# Patient Record
Sex: Female | Born: 1995 | Race: White | Hispanic: No | Marital: Single | Smoking: Never smoker
Health system: Southern US, Community
[De-identification: ages and names within clinical notes are randomized; demographics above are authoritative.]

## PROBLEM LIST (undated history)

## (undated) DIAGNOSIS — D649 Anemia, unspecified: Secondary | ICD-10-CM

## (undated) DIAGNOSIS — E162 Hypoglycemia, unspecified: Secondary | ICD-10-CM

## (undated) HISTORY — PX: INTRAUTERINE DEVICE (IUD) INSERTION: SHX5877

## (undated) HISTORY — PX: OTHER SURGICAL HISTORY: SHX169

## (undated) HISTORY — PX: WISDOM TOOTH EXTRACTION: SHX21

---

## 2020-11-25 ENCOUNTER — Emergency Department (HOSPITAL_BASED_OUTPATIENT_CLINIC_OR_DEPARTMENT_OTHER): Payer: Medicaid Other

## 2020-11-25 ENCOUNTER — Other Ambulatory Visit: Payer: Self-pay

## 2020-11-25 ENCOUNTER — Encounter (HOSPITAL_BASED_OUTPATIENT_CLINIC_OR_DEPARTMENT_OTHER): Payer: Self-pay | Admitting: Emergency Medicine

## 2020-11-25 ENCOUNTER — Emergency Department (HOSPITAL_BASED_OUTPATIENT_CLINIC_OR_DEPARTMENT_OTHER)
Admission: EM | Admit: 2020-11-25 | Discharge: 2020-11-25 | Disposition: A | Discharge status: Home / Self Care | Payer: Medicaid Other | Attending: Emergency Medicine | Admitting: Emergency Medicine

## 2020-11-25 DIAGNOSIS — R1032 Left lower quadrant pain: Secondary | ICD-10-CM | POA: Insufficient documentation

## 2020-11-25 DIAGNOSIS — R102 Pelvic and perineal pain: Secondary | ICD-10-CM

## 2020-11-25 DIAGNOSIS — Z91040 Latex allergy status: Secondary | ICD-10-CM | POA: Insufficient documentation

## 2020-11-25 DIAGNOSIS — N898 Other specified noninflammatory disorders of vagina: Secondary | ICD-10-CM | POA: Diagnosis present

## 2020-11-25 HISTORY — DX: Anemia, unspecified: D64.9

## 2020-11-25 HISTORY — DX: Hypoglycemia, unspecified: E16.2

## 2020-11-25 LAB — URINALYSIS, ROUTINE W REFLEX MICROSCOPIC
Bilirubin Urine: NEGATIVE
Glucose, UA: NEGATIVE mg/dL
Hgb urine dipstick: NEGATIVE
Ketones, ur: NEGATIVE mg/dL
Nitrite: NEGATIVE
Protein, ur: NEGATIVE mg/dL
Specific Gravity, Urine: 1.01 (ref 1.005–1.030)
pH: 6 (ref 5.0–8.0)

## 2020-11-25 LAB — URINALYSIS, MICROSCOPIC (REFLEX)

## 2020-11-25 LAB — WET PREP, GENITAL
Clue Cells Wet Prep HPF POC: NONE SEEN
Sperm: NONE SEEN
Trich, Wet Prep: NONE SEEN
Yeast Wet Prep HPF POC: NONE SEEN

## 2020-11-25 LAB — PREGNANCY, URINE: Preg Test, Ur: NEGATIVE

## 2020-11-25 NOTE — ED Notes (Signed)
Pelvic cart at bedside. 

## 2020-11-25 NOTE — ED Provider Notes (Signed)
[facility] EMERGENCY DEPARTMENT Provider Note   CSN: [id number] Arrival date & time: 11/25/20  5732     History Chief Complaint  Patient presents with  . Abdominal Pain  . Vaginal Discharge    Tara Roman is a 24 y.o. female.  Patient is a 24 year old female with history of anemia and hypoglycemia.  She presents today for evaluation of vaginal leakage.  Patient tells me she woke up in the night to go to the bathroom and clear fluid leak from her vagina and down her leg.  She describes mild discomfort to the left lower quadrant, but otherwise denies pain.  She denies any bleeding.  Patient tells me she has had an IUD in place for nearly 1 year and denies being sexually active for the past 4 months.  The history is provided by the patient.  Abdominal Pain Pain location:  LLQ Pain radiates to:  Does not radiate Pain severity:  Mild Onset quality:  Sudden Timing:  Constant Progression:  Unchanged Relieved by:  Nothing Worsened by:  Nothing Associated symptoms: vaginal discharge   Vaginal Discharge Associated symptoms: abdominal pain        Past Medical History:  Diagnosis Date  . Anemia   . Hypoglycemia     There are no problems to display for this patient.   Past Surgical History:  Procedure Laterality Date  . INTRAUTERINE DEVICE (IUD) INSERTION    . OTHER SURGICAL HISTORY    . WISDOM TOOTH EXTRACTION       OB History   No obstetric history on file.     No family history on file.  Social History   Tobacco Use  . Smoking status: Never Smoker  . Smokeless tobacco: Never Used  Substance Use Topics  . Alcohol use: Not on file  . Drug use: Not on file    Home Medications Prior to Admission medications   Medication Sig Start Date End Date Taking? Authorizing Provider  levonorgestrel (MIRENA) 20 MCG/24HR IUD by Intrauterine route. 02/01/17 02/01/21 Yes [provider]    Allergies    Latex  Review of Systems   Review of Systems    Gastrointestinal: Positive for abdominal pain.  Genitourinary: Positive for vaginal discharge.  All other systems reviewed and are negative.   Physical Exam Updated Vital Signs BP 118/66   Pulse 75   Temp 98.5 F (36.9 C) (Oral)   Resp 16   Ht 5\' 5"  (1.651 m)   Wt 83.4 kg   LMP 11/11/2020   SpO2 100%   BMI 30.59 kg/m   Physical Exam Vitals and nursing note reviewed.  Constitutional:      General: She is not in acute distress.    Appearance: She is well-developed. She is not diaphoretic.  HENT:     Head: Normocephalic and atraumatic.  Cardiovascular:     Rate and Rhythm: Normal rate and regular rhythm.     Heart sounds: No murmur heard.  No friction rub. No gallop.   Pulmonary:     Effort: Pulmonary effort is normal. No respiratory distress.     Breath sounds: Normal breath sounds. No wheezing.  Abdominal:     General: Bowel sounds are normal. There is no distension.     Palpations: Abdomen is soft.     Tenderness: There is abdominal tenderness in the left lower quadrant. There is no right CVA tenderness, left CVA tenderness, guarding or rebound.  Genitourinary:    Vagina: Tenderness present. No vaginal discharge  or bleeding.     Comments: There is mild right adnexal tenderness but no masses are palpable. Musculoskeletal:        General: Normal range of motion.     Cervical back: Normal range of motion and neck supple.  Skin:    General: Skin is warm and dry.  Neurological:     Mental Status: She is alert and oriented to person, place, and time.     ED Results / Procedures / Treatments   Labs (all labs ordered are listed, but only abnormal results are displayed) Labs Reviewed  WET PREP, GENITAL  PREGNANCY, URINE  URINALYSIS, ROUTINE W REFLEX MICROSCOPIC  GC/CHLAMYDIA PROBE AMP ([place]) NOT AT [facility]    EKG None  Radiology No results found.  Procedures Procedures (including critical care time)  Medications Ordered in ED Medications - No data to  display  ED Course  I have reviewed the triage vital signs and the nursing notes.  Pertinent labs & imaging results that were available during my care of the patient were reviewed by me and considered in my medical decision making (see chart for details).    MDM Rules/Calculators/A&P  Patient is a 24 year old female presenting with complaints of vaginal discharge as described in the HPI.  She describes a clear gush of fluid that ran down her leg this morning when she woke from sleep.  Pelvic examination is unremarkable with unremarkable wet prep.  GC and Chlamydia cultures are pending.  Pregnancy test is negative.  Ultrasound shows no evidence of fluid collection, torsion, or other abnormality.  At this point, I feel as though emergent pathology has been ruled out.  Patient will be discharged with rest and as needed return.  Final Clinical Impression(s) / ED Diagnoses Final diagnoses:  None    Rx / DC Orders ED Discharge Orders    None       Geoffery Lyons, MD 11/25/20 1202

## 2020-11-25 NOTE — ED Triage Notes (Signed)
Pt states she has an copper IUD in place for less than one year.  During the night she woke up to urinate and clear fluid gushed down her leg.  It was clear.  She was able to urinate afterwards but continues to have clear fluid coming out of her vagina.

## 2020-11-25 NOTE — Discharge Instructions (Addendum)
Return to the emergency department if you develop high fevers, worsening pain, severe bleeding, or other new and concerning symptoms.

## 2020-11-25 NOTE — ED Notes (Signed)
Review D/C papers with pt, pt states understanding, pt denies questions at this time. 

## 2020-11-26 LAB — GC/CHLAMYDIA PROBE AMP (~~LOC~~) NOT AT ARMC
Chlamydia: NEGATIVE
Comment: NEGATIVE
Comment: NORMAL
Neisseria Gonorrhea: NEGATIVE

## 2021-04-09 IMAGING — US US PELVIS COMPLETE TRANSABD/TRANSVAG W DUPLEX
1 series · 13 of 25 positions shown · non-contrast
Comparison: 04/17/2020

CLINICAL DATA: Pelvic pain, incidental clear fluid coming from
vagina, has had Seraphin IUD for 1 year; LMP 11/10/2020

EXAM:
TRANSABDOMINAL AND TRANSVAGINAL ULTRASOUND OF PELVIS
DOPPLER ULTRASOUND OF OVARIES
TECHNIQUE: Both transabdominal and transvaginal ultrasound examinations of the
pelvis were performed. Transabdominal technique was performed for
global imaging of the pelvis including uterus, ovaries, adnexal
regions, and pelvic cul-de-sac.
It was necessary to proceed with endovaginal exam following the
transabdominal exam to visualize the endometrium. Color and duplex
Doppler ultrasound was utilized to evaluate blood flow to the
ovaries.

[Series 1: us pelvis complete transabd/transvag w duplex · 75 acquisitions, 13 frames shown]
[im 1/75]
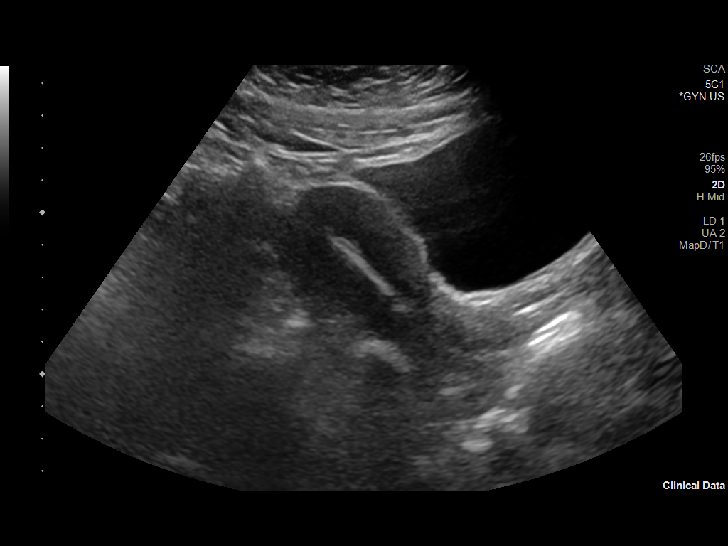
[im 7/75]
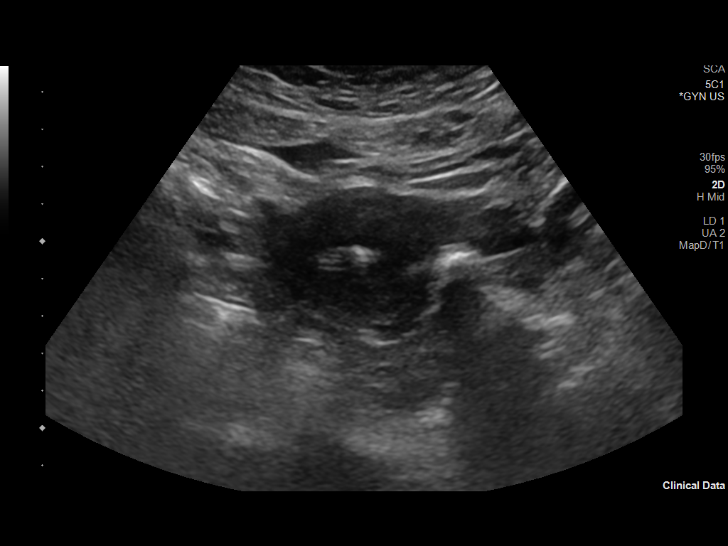
[im 13/75]
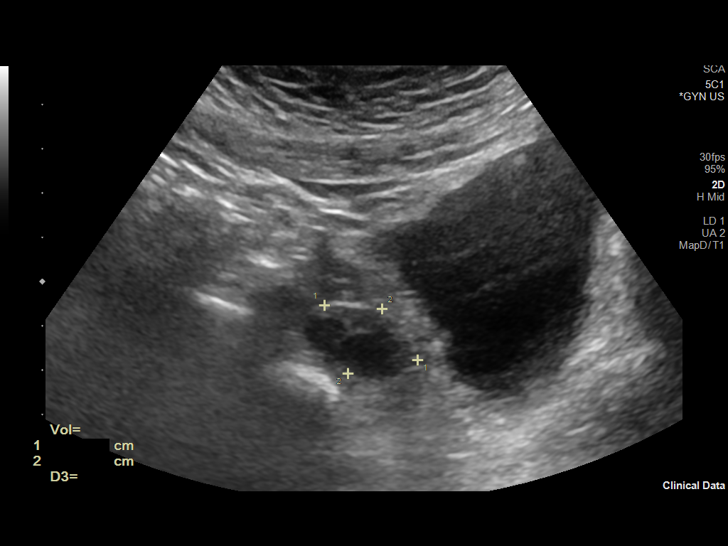
[im 19/75]
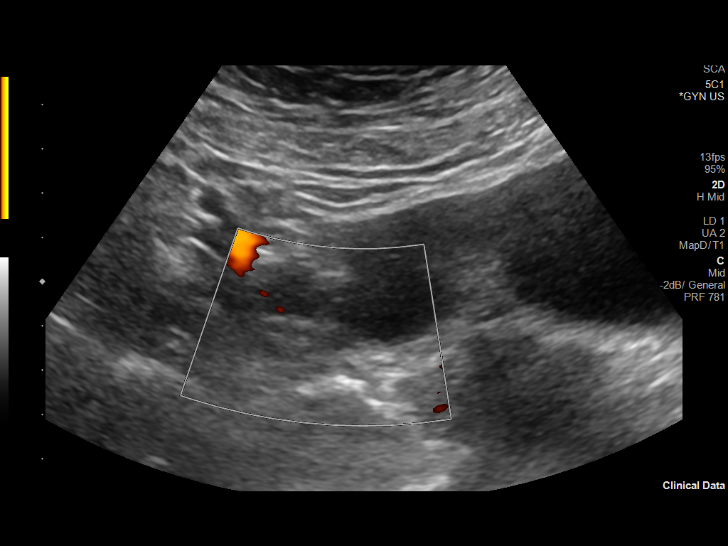
[im 25/75]
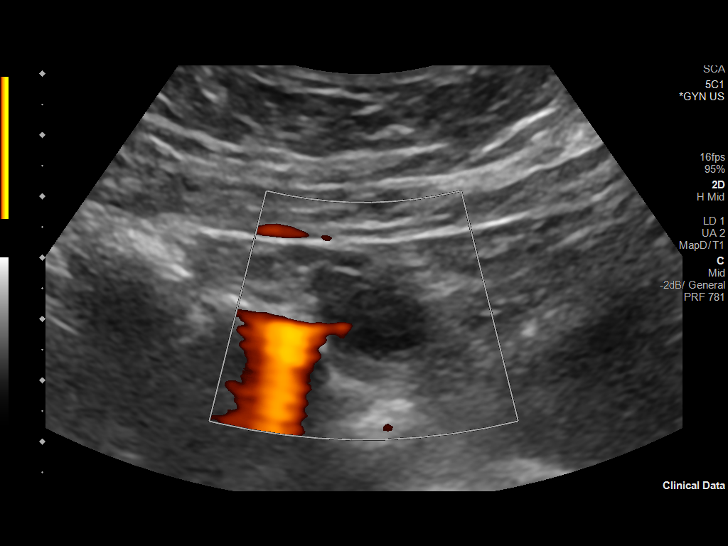
[im 31/75]
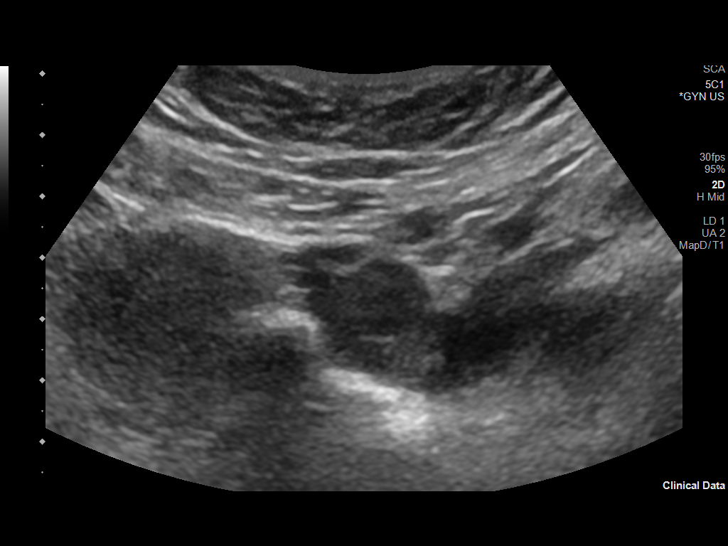
[im 38/75]
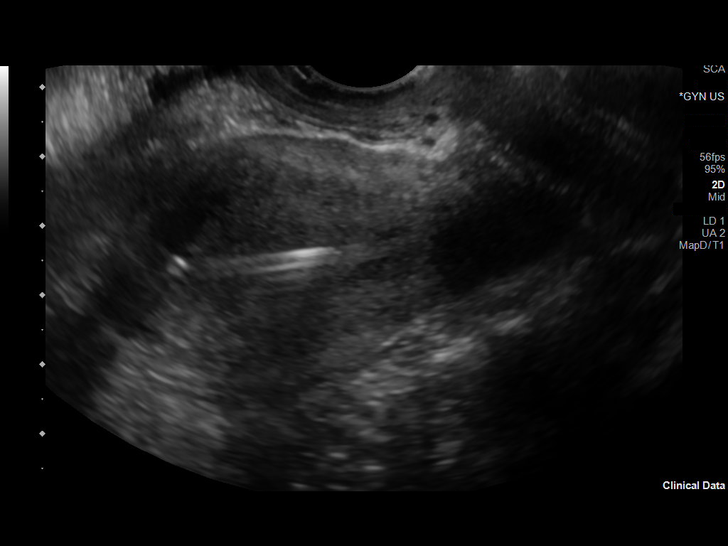
[im 44/75]
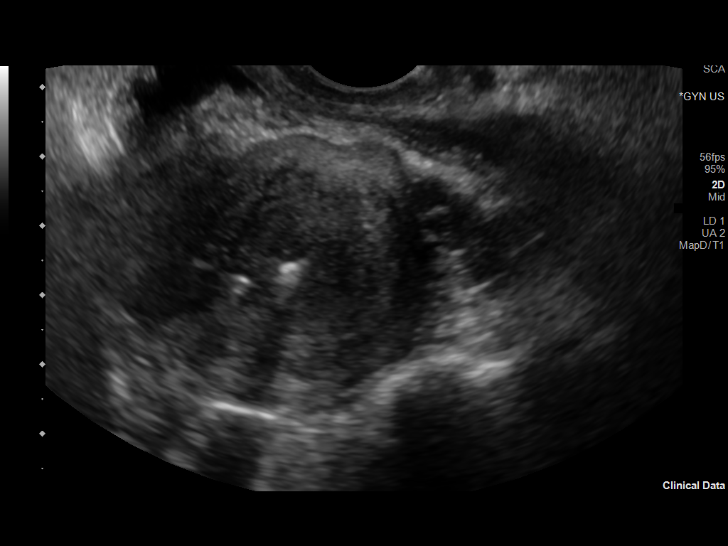
[im 50/75]
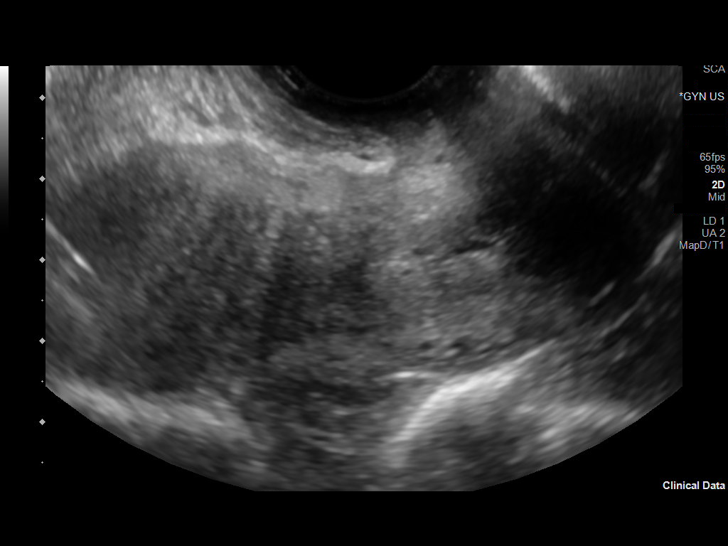
[im 56/75]
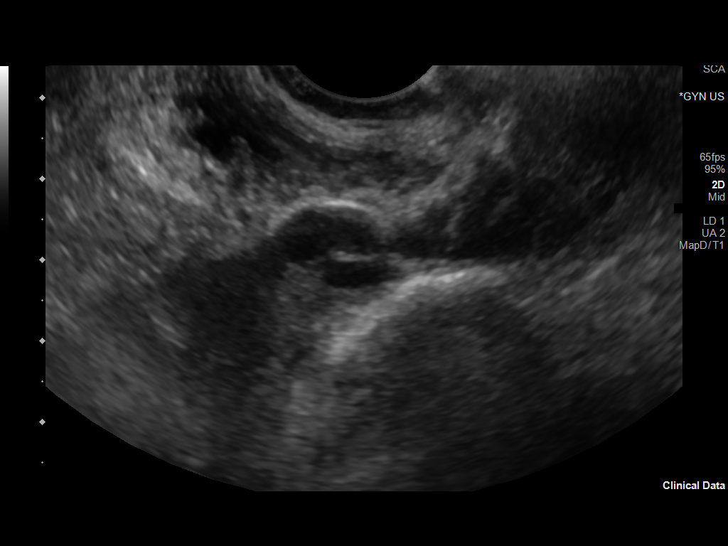
[im 62/75]
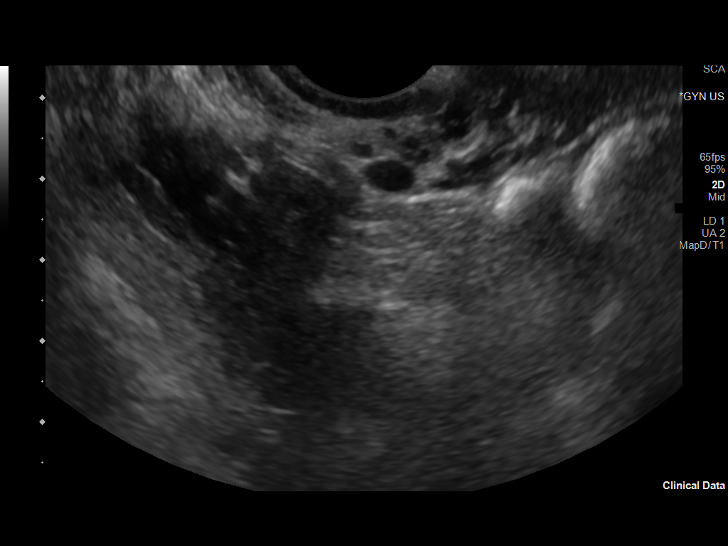
[im 68/75]
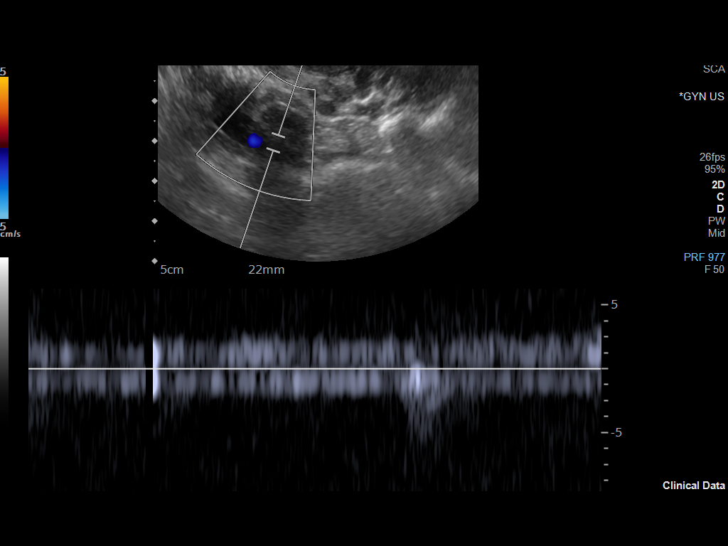
[im 75/75]
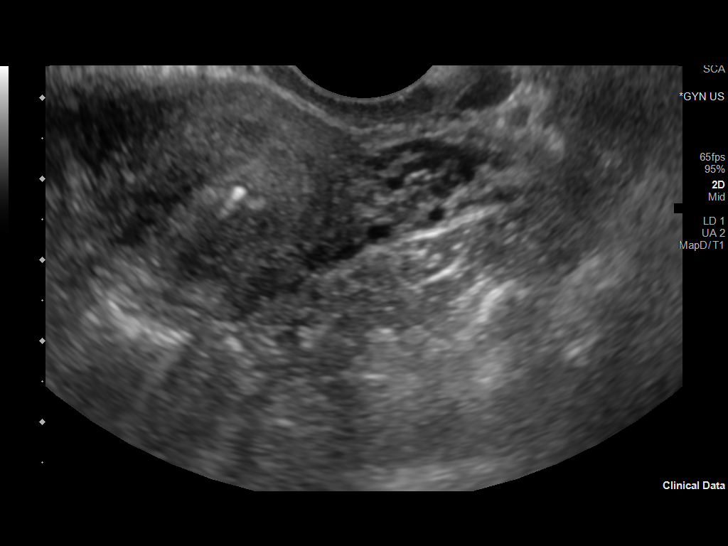

[13 of 25 positions shown; findings below may reference images not displayed]

FINDINGS: Uterus

Measurements: 8.9 x 3.7 x 4.4 cm = volume: 77 mL. Anteverted. Normal
morphology without mass

Endometrium

Thickness: 5 mm. IUD in expected position at upper uterine segment
endometrial canal. No endometrial fluid or mass.

Right ovary

Measurements: 2.4 x 1.6 x 2.7 cm = volume: 5.7 mL. Normal
morphology, containing a small physiologic corpus luteum. No mass.

Left ovary

Measurements: 2.4 x 1.4 x 1.8 cm = volume: 3.1 mL. Normal morphology
without mass

Pulsed Doppler evaluation of both ovaries demonstrates normal
low-resistance arterial and venous waveforms.

Other findings

No free pelvic fluid.  No adnexal masses.
IMPRESSION: IUD in expected position at upper uterine segment endometrial canal.

Otherwise normal exam.
